# Patient Record
Sex: Female | Born: 1965 | Hispanic: No | Marital: Married | Smoking: Never smoker
Health system: Southern US, Community
[De-identification: ages and names within clinical notes are randomized; demographics above are authoritative.]

## PROBLEM LIST (undated history)

## (undated) DIAGNOSIS — K859 Acute pancreatitis without necrosis or infection, unspecified: Secondary | ICD-10-CM

## (undated) DIAGNOSIS — J189 Pneumonia, unspecified organism: Secondary | ICD-10-CM

## (undated) DIAGNOSIS — D649 Anemia, unspecified: Secondary | ICD-10-CM

## (undated) DIAGNOSIS — N200 Calculus of kidney: Secondary | ICD-10-CM

## (undated) DIAGNOSIS — E119 Type 2 diabetes mellitus without complications: Secondary | ICD-10-CM

## (undated) DIAGNOSIS — N390 Urinary tract infection, site not specified: Secondary | ICD-10-CM

## (undated) DIAGNOSIS — C50919 Malignant neoplasm of unspecified site of unspecified female breast: Secondary | ICD-10-CM

## (undated) HISTORY — DX: Calculus of kidney: N20.0

## (undated) HISTORY — DX: Pneumonia, unspecified organism: J18.9

## (undated) HISTORY — DX: Urinary tract infection, site not specified: N39.0

## (undated) HISTORY — DX: Acute pancreatitis without necrosis or infection, unspecified: K85.90

## (undated) HISTORY — DX: Anemia, unspecified: D64.9

## (undated) HISTORY — PX: PEG TUBE PLACEMENT: SUR1034

## (undated) HISTORY — PX: CHOLECYSTECTOMY: SHX55

## (undated) HISTORY — DX: Malignant neoplasm of unspecified site of unspecified female breast: C50.919

## (undated) HISTORY — PX: PANCREAS SURGERY: SHX731

## (undated) HISTORY — DX: Type 2 diabetes mellitus without complications: E11.9

---

## 1997-06-01 HISTORY — PX: OTHER SURGICAL HISTORY: SHX169

## 2010-08-31 ENCOUNTER — Ambulatory Visit: Payer: Self-pay | Admitting: Internal Medicine

## 2010-09-08 ENCOUNTER — Emergency Department: Payer: Self-pay | Admitting: Emergency Medicine

## 2010-09-09 ENCOUNTER — Inpatient Hospital Stay: Payer: Self-pay | Admitting: Internal Medicine

## 2010-09-09 DIAGNOSIS — R0789 Other chest pain: Secondary | ICD-10-CM

## 2010-09-23 ENCOUNTER — Inpatient Hospital Stay: Payer: Self-pay | Admitting: Internal Medicine

## 2010-09-25 ENCOUNTER — Inpatient Hospital Stay: Payer: Self-pay | Admitting: Psychiatry

## 2010-09-27 LAB — CANCER ANTIGEN 27.29: CA 27.29: 12.3 U/mL (ref 0.0–38.6)

## 2010-09-30 ENCOUNTER — Ambulatory Visit: Payer: Self-pay | Admitting: Gynecologic Oncology

## 2010-10-31 ENCOUNTER — Ambulatory Visit: Payer: Self-pay | Admitting: Gynecologic Oncology

## 2010-10-31 ENCOUNTER — Ambulatory Visit: Payer: Self-pay | Admitting: Internal Medicine

## 2012-11-01 IMAGING — US US RENAL KIDNEY
1 series · 14 of 25 positions shown · non-contrast
Comparison: none

REASON FOR EXAM: R/O Stones (urinary retention)
COMMENTS:

[Series 1: us renal kidney · 0.29mm/px · 14 of 26 slices shown]
[im 1/26]
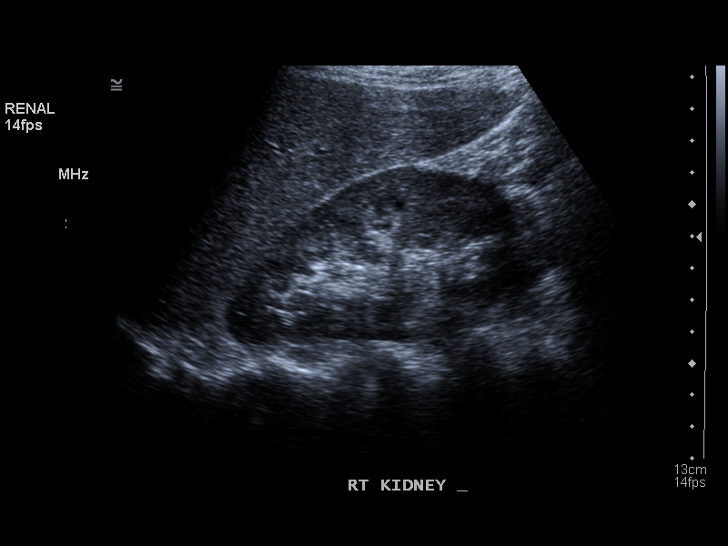
[im 3/26]
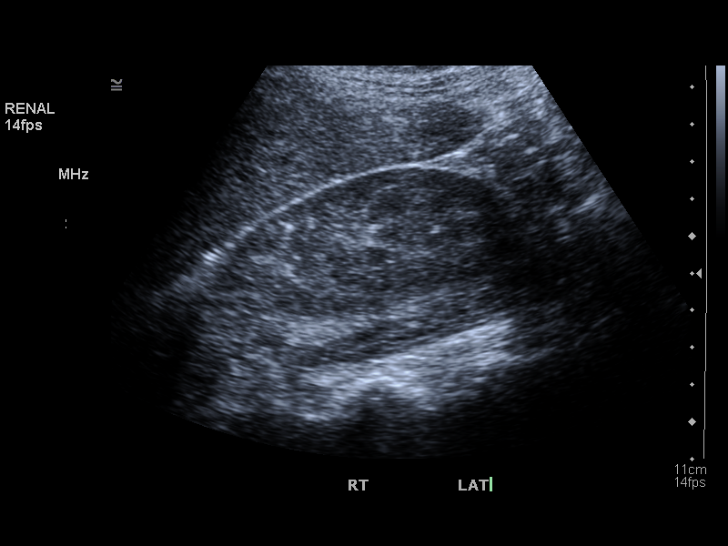
[im 5/26]
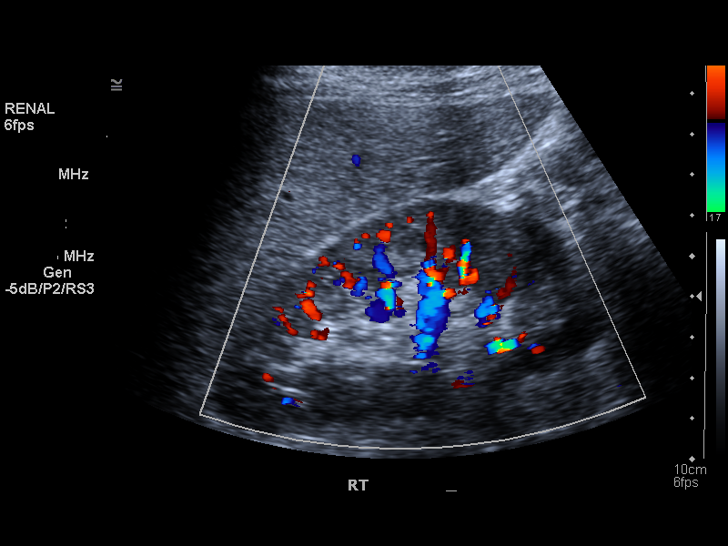
[im 7/26]
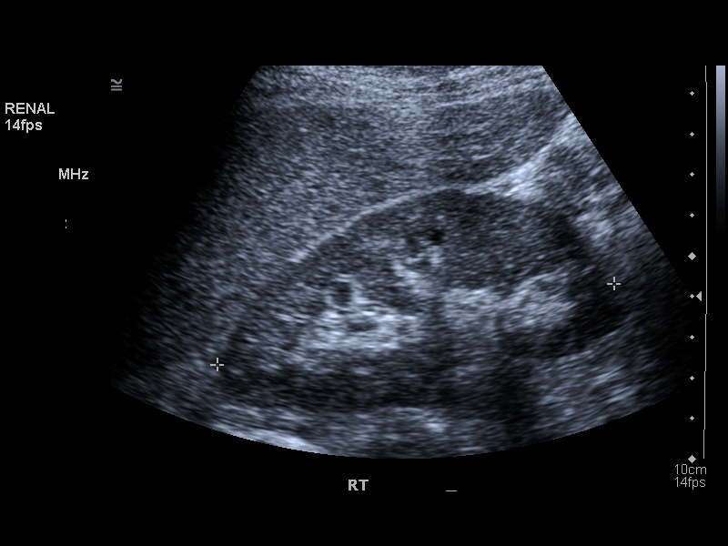
[im 9/26]
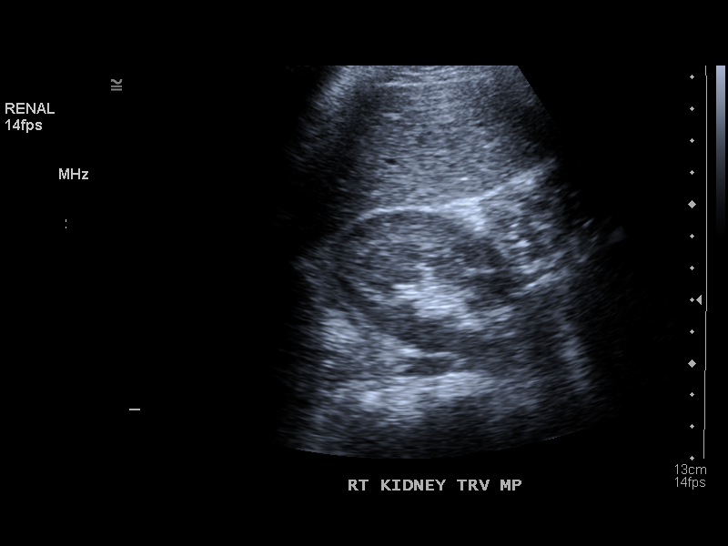
[im 10/26]
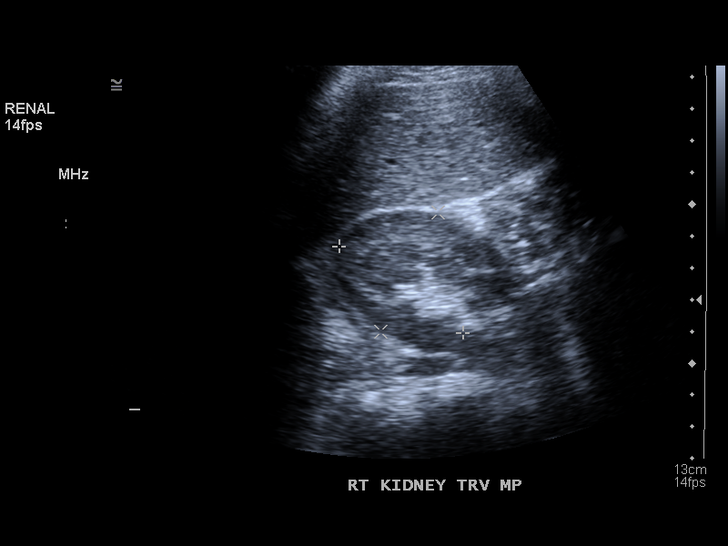
[im 12/26]
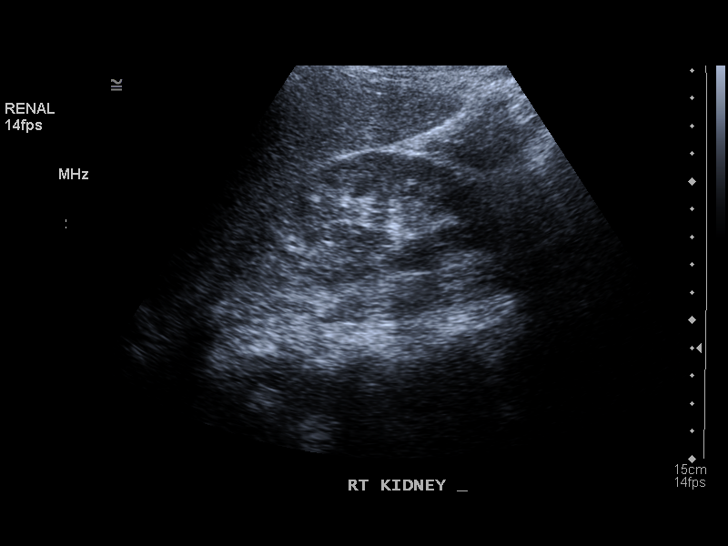
[im 14/26]
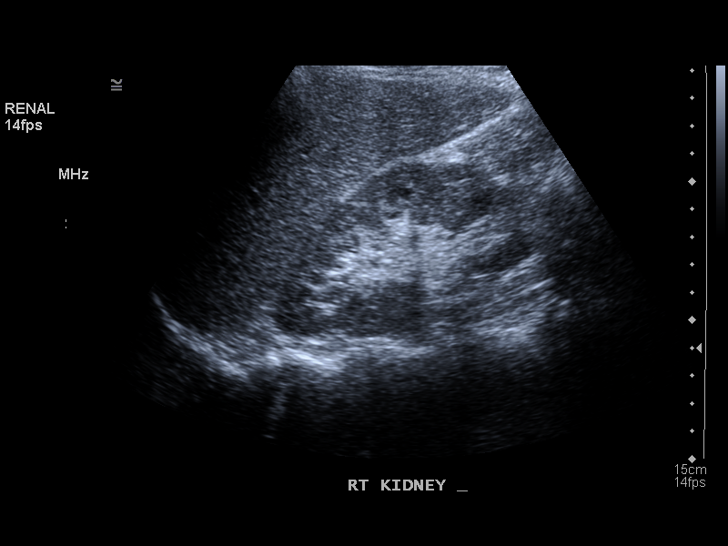
[im 16/26]
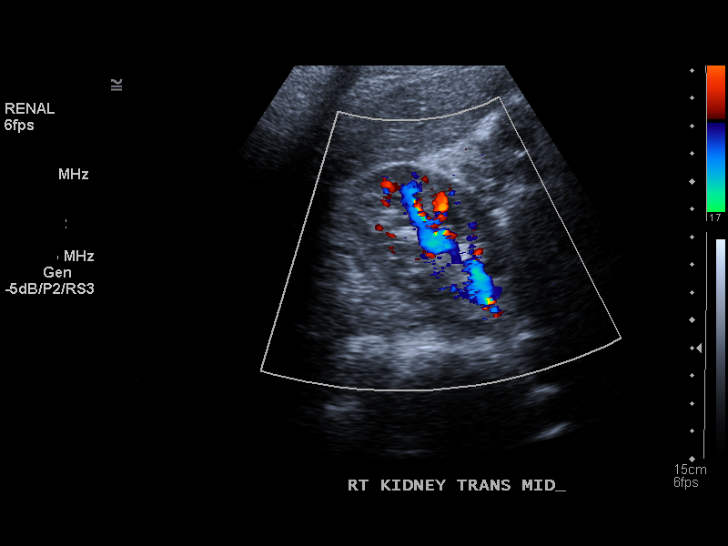
[im 17/26]
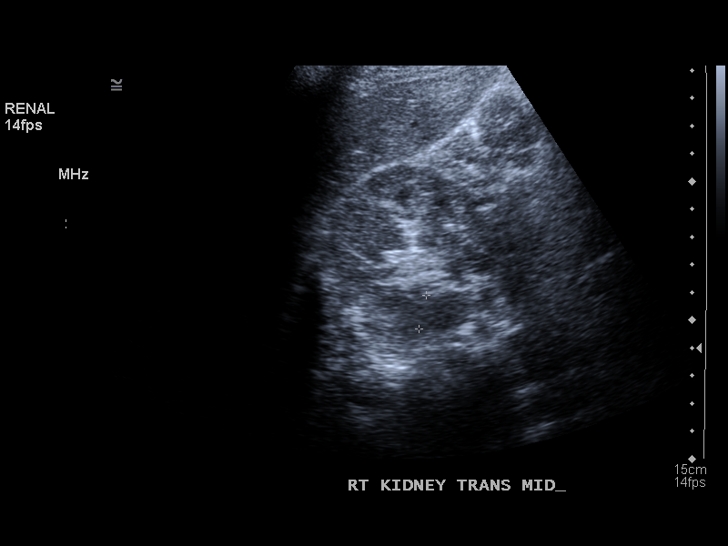
[im 19/26]
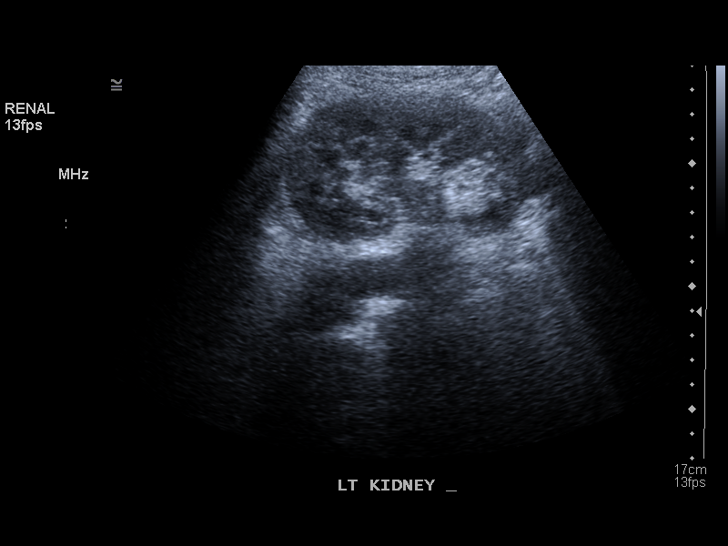
[im 21/26]
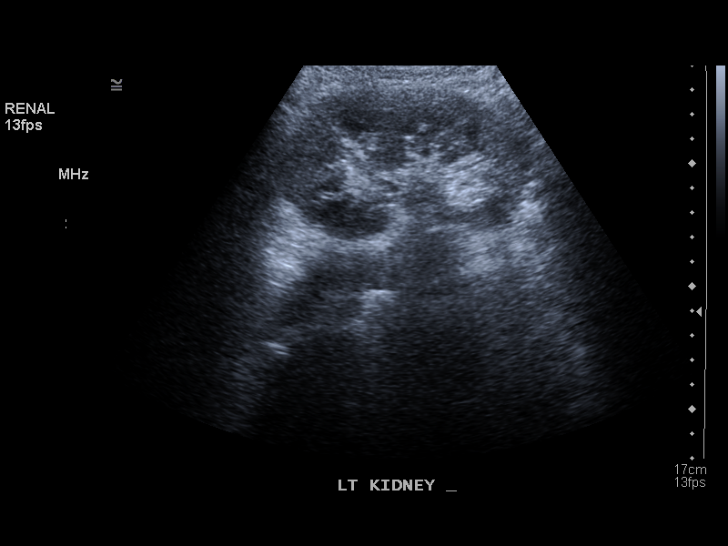
[im 23/26]
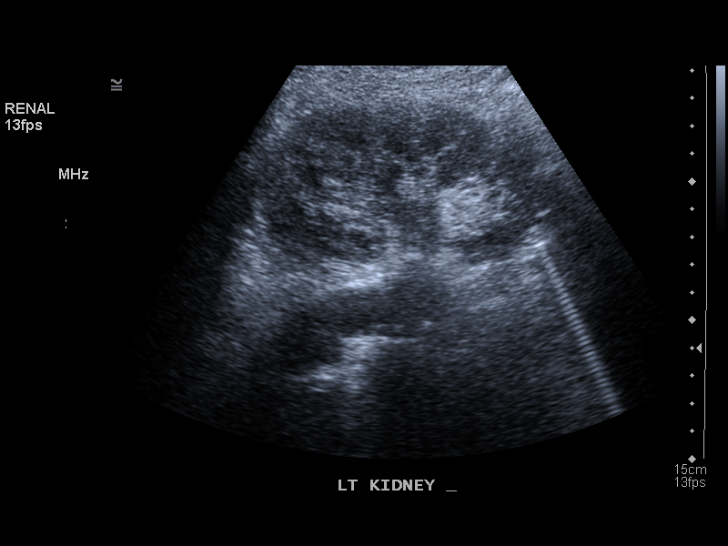
[im 26/26]
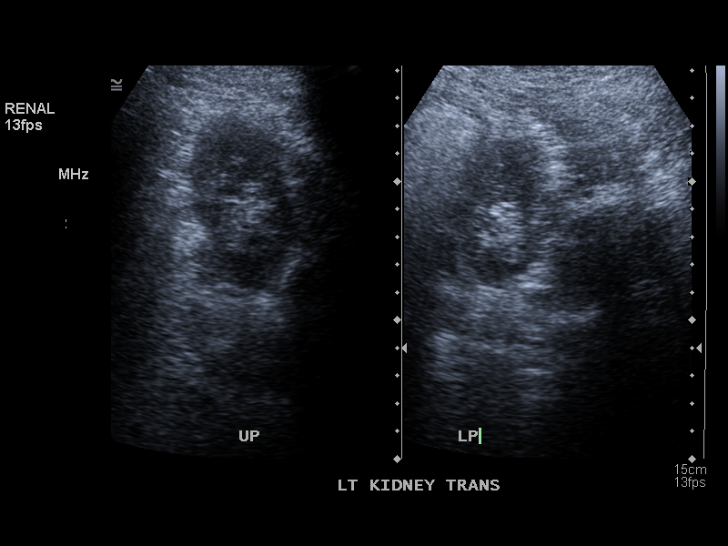

[14 of 25 positions shown; findings below may reference images not displayed]

PROCEDURE:     US  - US KIDNEY  - September 30, 2010 [DATE]

RESULT:     The right kidney measures 9.97 cm x 4.75 cm x 4.16 cm and the
left kidney measures 9.86 cm x 5.6 cm x 5.26 cm. The renal cortical margins
bilaterally are smooth. No solid or cystic renal mass lesions are seen. No
renal calcifications are noted. There is no hydronephrosis. The urinary
bladder is empty and is not visualized adequately for evaluation. A bladder
catheter is present.
IMPRESSION: 1. The kidneys show no significant abnormalities.
2. The urinary bladder is empty and is not visualized on this exam.

## 2014-11-27 ENCOUNTER — Encounter: Payer: Self-pay | Admitting: Internal Medicine

## 2015-01-31 ENCOUNTER — Ambulatory Visit (INDEPENDENT_AMBULATORY_CARE_PROVIDER_SITE_OTHER): Payer: BLUE CROSS/BLUE SHIELD | Admitting: Internal Medicine

## 2015-01-31 ENCOUNTER — Encounter: Payer: Self-pay | Admitting: Internal Medicine

## 2015-01-31 ENCOUNTER — Other Ambulatory Visit (INDEPENDENT_AMBULATORY_CARE_PROVIDER_SITE_OTHER): Payer: BLUE CROSS/BLUE SHIELD

## 2015-01-31 VITALS — BP 80/40 | HR 76 | Ht 67.25 in | Wt 125.0 lb

## 2015-01-31 DIAGNOSIS — Z8719 Personal history of other diseases of the digestive system: Secondary | ICD-10-CM

## 2015-01-31 DIAGNOSIS — Z853 Personal history of malignant neoplasm of breast: Secondary | ICD-10-CM | POA: Diagnosis not present

## 2015-01-31 DIAGNOSIS — R634 Abnormal weight loss: Secondary | ICD-10-CM

## 2015-01-31 DIAGNOSIS — T50905A Adverse effect of unspecified drugs, medicaments and biological substances, initial encounter: Secondary | ICD-10-CM

## 2015-01-31 DIAGNOSIS — K861 Other chronic pancreatitis: Secondary | ICD-10-CM

## 2015-01-31 LAB — COMPREHENSIVE METABOLIC PANEL
ALK PHOS: 52 U/L (ref 39–117)
ALT: 22 U/L (ref 0–35)
AST: 25 U/L (ref 0–37)
Albumin: 4.1 g/dL (ref 3.5–5.2)
BILIRUBIN TOTAL: 0.3 mg/dL (ref 0.2–1.2)
BUN: 7 mg/dL (ref 6–23)
CALCIUM: 9 mg/dL (ref 8.4–10.5)
CO2: 28 mEq/L (ref 19–32)
Chloride: 105 mEq/L (ref 96–112)
Creatinine, Ser: 0.59 mg/dL (ref 0.40–1.20)
GFR: 115.71 mL/min (ref >60.00)
GLUCOSE: 84 mg/dL (ref 70–99)
POTASSIUM: 4 meq/L (ref 3.5–5.1)
Sodium: 140 mEq/L (ref 135–145)
TOTAL PROTEIN: 6.8 g/dL (ref 6.0–8.3)

## 2015-01-31 LAB — CBC WITH DIFFERENTIAL/PLATELET
BASOS ABS: 0 10*3/uL (ref 0.0–0.1)
Basophils Relative: 1.3 % (ref 0.0–3.0)
Eosinophils Absolute: 0 10*3/uL (ref 0.0–0.7)
Eosinophils Relative: 0.1 % (ref 0.0–5.0)
Hemoglobin: 6.3 g/dL — CL (ref 12.0–15.0)
LYMPHS PCT: 26.3 % (ref 12.0–46.0)
Lymphs Abs: 0.8 10*3/uL (ref 0.7–4.0)
MCHC: 29.3 g/dL — ABNORMAL LOW (ref 30.0–36.0)
MCV: 64.7 fl — ABNORMAL LOW (ref 78.0–100.0)
MONOS PCT: 9.6 % (ref 3.0–12.0)
Monocytes Absolute: 0.3 10*3/uL (ref 0.1–1.0)
Neutro Abs: 1.9 10*3/uL (ref 1.4–7.7)
Neutrophils Relative %: 62.7 % (ref 43.0–77.0)
Platelets: 486 10*3/uL — ABNORMAL HIGH (ref 150.0–400.0)
RBC: 3.32 Mil/uL — AB (ref 3.87–5.11)
RDW: 19 % — ABNORMAL HIGH (ref 11.5–15.5)
WBC: 3.1 10*3/uL — ABNORMAL LOW (ref 4.0–10.5)

## 2015-01-31 LAB — IBC PANEL
Iron: 15 ug/dL — ABNORMAL LOW (ref 42–145)
SATURATION RATIOS: 2.9 % — AB (ref 20.0–50.0)
Transferrin: 371 mg/dL — ABNORMAL HIGH (ref 212.0–360.0)

## 2015-01-31 LAB — VITAMIN B12: VITAMIN B 12: 147 pg/mL — AB (ref 211–911)

## 2015-01-31 LAB — IGA: IGA: 152 mg/dL (ref 68–378)

## 2015-01-31 LAB — HEMOGLOBIN A1C: Hgb A1c MFr Bld: 5.3 % (ref 4.6–6.5)

## 2015-01-31 LAB — FOLATE: FOLATE: 7.3 ng/mL (ref >5.9)

## 2015-01-31 MED ORDER — PANCRELIPASE (LIP-PROT-AMYL) 25000 UNITS PO CPEP: 2.0000 | ORAL_CAPSULE | Freq: Three times a day (TID) | ORAL | Status: AC

## 2015-01-31 NOTE — Progress Notes (Signed)
Patient ID: Meredith Wallace, female   DOB: May 19, 1966, 49 y.o.   MRN: [id number] HPI: Meredith Wallace is a 49 year old female with a complex past medical history which includes but is not limited to chronic pancreatitis felt secondary to chemotherapy given for breast cancer who is seen to establish care. She is here alone today. She reports she was diagnosed with breast cancer in 2005. She had bilateral mastectomy as well as chemotherapy. No history of radiation. This was in [place] for she was living. In 2007 she developed pancreatitis though at that time when she had acute pancreatitis for the first time she was told she also had chronic pancreatitis though she was unaware of this. She spent she states the majority of a 5 year period in an out of the hospital. She reports multiple abdominal surgeries, history of PEG tube placement, for some time she was on parenteral nutrition. She reports being on multiple medications including oral and transdermal narcotics. She states that she wished for death during these years but for some reason they would not allow her to revoke her DO NOT RESUSCITATE. Eventually she started to improve and moved to [place] in 2012. She is now off all medications entirely. In May and June 2016 she had some mild upper abdominal pain reminiscent of pancreatitis but she is adamant that this was not a flare. The pain has since resolved. Today she has no abdominal pain. Reports appetite as good as she eats a very low-fat diet. She denies diarrhea. Reports bowel movements every 2-3 days. Denies blood in her stool or melena. She denies alcohol use. She is uncertain if her gallbladder has ever been removed. She's had one flare of acute pancreatitis and was transferred from [facility] to [facility] several years ago. No pancreatic care since this hospitalization. She was seen at [facility] in 2015 and oncology given her history of breast cancer. There is been no known recurrence of this disease. Family history  notable for breast cancer and her father had hepatitis C. 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 She has started traveling in the last 2 weeks playing poker again after having not done so since being ill with pancreatitis. She does report having not been able to gain weight over the last several months but she feels that this is secondary to restricting fat intake in her diet. She was previously taking Creon but found this not to be helpful.  no follow-up pancreatic care in at least 4 years since moving to [place]. She was seen at [facility]  Past Medical History  Diagnosis Date  . Anemia   . Breast cancer   . Diabetes 1998-2000  . Kidney stones 1997-present  . Pancreatitis   . Pneumonia 1972-2008  . Urinary tract infection     Past Surgical History  Procedure Laterality Date  . Cholecystectomy    . Pancreas surgery  2007-2011  . Stomach transection  1999    No outpatient prescriptions prior to visit.   No facility-administered medications prior to visit.    No Known Allergies  Family History  Problem Relation Age of Onset  . Heart disease Mother   . Diabetes Maternal Grandmother   . Hepatitis C Father     Social History  Substance Use Topics  . Smoking status: Never Smoker   . Smokeless tobacco: None  . Alcohol Use: 0.0 oz/week    0 Standard drinks or equivalent per week     Comment: occasionally    ROS:  As per history of present illness, otherwise negative  BP 80/40 mmHg  Pulse 76  Ht 5' 7.25" (1.708 m)  Wt 125 lb (56.7 kg)  BMI 19.44 kg/m2  LMP 06/02/1999 Constitutional: Well-developed, though thin pale female. No distress. HEENT: Normocephalic and atraumatic. Oropharynx is clear and moist. No oropharyngeal exudate. Conjunctivae are pale.  No scleral icterus. Neck: Neck supple. Trachea midline. Cardiovascular: Normal rate, regular rhythm and intact distal pulses. No M/R/G Pulmonary/chest: Effort normal and breath sounds normal. No  wheezing, rales or rhonchi. Abdominal: Soft, nontender, nondistended. Bowel sounds active throughout. Well-healed abdominal scars and former PEG site visible in the left upper quadrant  Extremities: no clubbing, cyanosis, trace pretibial edema Lymphadenopathy: No cervical adenopathy noted. Neurological: Alert and oriented to person place and time. Skin: Skin is warm and dry. No rashes noted. Psychiatric: Normal mood and affect. Behavior is normal.  RELEVANT LABS AND IMAGING: Labs ordered today We have tried to track down previous GI records from [place] but have yet been unsuccessful  ASSESSMENT/PLAN:  49 year old female with a complex past medical history which includes but is not limited to chronic pancreatitis felt secondary to chemotherapy given for breast cancer who is seen to establish care.   1. Chronic pancreatitis -- felt chemotherapy-induced. Some recent weight loss. Mild pain in May and June of this year though not needing to seek care for acute flare. I recommended repeat abdominal imaging given her history of chronic pancreatitis to evaluate pancreatic anatomy and also exclude malignancy. She is aware she is higher risk for pancreatic malignancy given history of chronic pancreatitis. MRI pancreatic protocol with contrast. CBC, CMP today. Given weight loss and going to start Zenpep 2 caps with meals.  2. History of breast cancer -- I feel appropriate that she establish with oncology locally. Oncology referral. She is also instructed in discussing external prostheses given her bilateral mastectomies.  Follow-up in 6 months unless labs or imaging deem earlier management    Cc:No referring provider defined for this encounter.

## 2015-01-31 NOTE — Patient Instructions (Addendum)
Your physician has requested that you go to the basement for the following lab work before leaving today: cbc, cmp.  You have been scheduled for an MRI at [facility] on 02/05/15. Your appointment time is 4 pm. Please arrive 15 minutes prior to your appointment time for registration purposes. Please make certain not to have anything to eat or drink 6 hours prior to your test. In addition, if you have any metal in your body, have a pacemaker or defibrillator, please be sure to let your ordering physician know. This test typically takes 45 minutes to 1 hour to complete.   We have sent the following medications to your pharmacy for you to pick up at your convenience: Zenpep 2 caps daily with meals.   We will call you with your referral to the [place].   Please call back to schedule a follow up in 6 months.

## 2015-02-01 ENCOUNTER — Other Ambulatory Visit: Payer: Self-pay | Admitting: Internal Medicine

## 2015-02-01 DIAGNOSIS — T50905A Adverse effect of unspecified drugs, medicaments and biological substances, initial encounter: Principal | ICD-10-CM

## 2015-02-01 DIAGNOSIS — K861 Other chronic pancreatitis: Secondary | ICD-10-CM

## 2015-02-05 ENCOUNTER — Telehealth: Payer: Self-pay | Admitting: *Deleted

## 2015-02-05 ENCOUNTER — Ambulatory Visit (HOSPITAL_COMMUNITY): Payer: BLUE CROSS/BLUE SHIELD

## 2015-02-05 ENCOUNTER — Ambulatory Visit (HOSPITAL_COMMUNITY): Admission: RE | Admit: 2015-02-05 | Payer: BLUE CROSS/BLUE SHIELD | Source: Ambulatory Visit

## 2015-02-05 DIAGNOSIS — D509 Iron deficiency anemia, unspecified: Secondary | ICD-10-CM

## 2015-02-05 MED ORDER — CYANOCOBALAMIN 1000 MCG/ML IJ SOLN: INTRAMUSCULAR | Status: AC

## 2015-02-05 MED ORDER — INTEGRA F 125-1 MG PO CAPS: 1.0000 | ORAL_CAPSULE | Freq: Every day | ORAL | Status: AC

## 2015-02-05 NOTE — Telephone Encounter (Signed)
I have spoken to patient to advise that her hemoglobin is very low at 6.3 and her iron and b12 are also low. I advised that per Dr Hilarie Fredrickson, he would like her to begin integra iron supplement, 1 capsule daily (rx sent to pharmacy) and that he would also like her to take b12 IM weekly x 1 month, then monthly thereafter (rx sent to pharmacy). Patient is scheduled for b12 injection teaching/1st injection on 02/11/15. Patient is in addition advised to begin an over the counter multivitamin rich in folate. She has been asked to let us know should she see any dark black stool or bright red blood with stool. Patient's MRI abdomen was rescheduled from today, 02/05/15 to 02/11/15. Insurance has not yet given approval for MRI and patient will be out of town from tomorrow until next Sunday evening. She will be back in town only on Monday of next week. Patient will come for MRI at [facility] radiology 02/11/15 @ 8 am. She will then come to our office for b12 injection training and will then go for labwork in our basement for CBC (per Dr Vena Rua request that CBC be rechecked in 1 week) and tissue transglutamase, folate (Dr Hilarie Fredrickson previously wanted these tests added to bloodwork drawn 02/01/15 but not enough blood was drawn to complete these tests). I advised patient that she may need endoscopy/colonoscopy but that we would make a more definative plan after we receive MRI results. She verbalizes understanding of all the above. Of note, patient states that Dr Candis Schatz in [place], [place] was the surgeon that did at least 2 of her pancreatic surgeries.

## 2015-02-05 NOTE — Telephone Encounter (Signed)
-----   Message from Algernon Huxley, RN sent at 02/05/2015 10:10 AM EDT ----- .

## 2015-02-11 ENCOUNTER — Ambulatory Visit (HOSPITAL_COMMUNITY): Admission: RE | Admit: 2015-02-11 | Payer: BLUE CROSS/BLUE SHIELD | Source: Ambulatory Visit

## 2015-02-11 ENCOUNTER — Telehealth: Payer: Self-pay | Admitting: *Deleted

## 2015-02-11 ENCOUNTER — Other Ambulatory Visit: Payer: Self-pay | Admitting: Internal Medicine

## 2015-02-11 NOTE — Telephone Encounter (Signed)
Received referral from [facility] requesting pt to be seen since she has moved to [facility].  Called pt and she lives in [place] and would like to be seen at [place].  Emailed Tinnie Gens at referring to make her aware and request for her to f/u with them on scheduling.  Changed location in referral to [place].

## 2015-02-13 ENCOUNTER — Telehealth: Payer: Self-pay | Admitting: Internal Medicine

## 2015-02-13 NOTE — Telephone Encounter (Signed)
Patient states that actually, pharmacy gave her the needle.

## 2015-02-18 ENCOUNTER — Ambulatory Visit (HOSPITAL_COMMUNITY): Admission: RE | Admit: 2015-02-18 | Payer: BLUE CROSS/BLUE SHIELD | Source: Ambulatory Visit

## 2015-02-25 ENCOUNTER — Telehealth: Payer: Self-pay | Admitting: Internal Medicine

## 2015-02-25 DIAGNOSIS — R634 Abnormal weight loss: Secondary | ICD-10-CM

## 2015-02-25 DIAGNOSIS — K861 Other chronic pancreatitis: Secondary | ICD-10-CM

## 2015-02-25 DIAGNOSIS — D649 Anemia, unspecified: Secondary | ICD-10-CM

## 2015-02-25 DIAGNOSIS — T50905A Adverse effect of unspecified drugs, medicaments and biological substances, initial encounter: Secondary | ICD-10-CM

## 2015-02-25 NOTE — Telephone Encounter (Signed)
Pt states she cannot afford the MRI, she would have to pay the deductible out of pocket and she states she cannot afford it. MRI cancelled and Dr. Hilarie Fredrickson aware.

## 2015-02-26 ENCOUNTER — Ambulatory Visit (HOSPITAL_COMMUNITY): Admission: RE | Admit: 2015-02-26 | Payer: BLUE CROSS/BLUE SHIELD | Source: Ambulatory Visit

## 2015-02-26 NOTE — Telephone Encounter (Signed)
Left message for pt to call back  °

## 2015-02-26 NOTE — Telephone Encounter (Signed)
I understand financial constraint, however given her history, In terms and profound anemia I would like her to have cross-sectional imaging. Would CT scan be more affordable for her? If so please order CT scan abdomen and pelvis with IV contrast, attention to pancreas She also needs to return for repeat CBC and iron studies She also will likely need endoscopic procedures given iron deficiency but would like to CT scan and repeat labs first

## 2015-02-28 NOTE — Telephone Encounter (Signed)
Left message for pt to call back  °

## 2015-03-04 NOTE — Telephone Encounter (Signed)
Unable to reach pt after multiple attempts. Pt mailed a letter.

## 2015-03-05 ENCOUNTER — Other Ambulatory Visit: Payer: Self-pay

## 2015-03-05 DIAGNOSIS — D509 Iron deficiency anemia, unspecified: Secondary | ICD-10-CM

## 2015-03-14 ENCOUNTER — Telehealth: Payer: Self-pay | Admitting: Internal Medicine

## 2015-03-14 NOTE — Telephone Encounter (Signed)
I have contacted [facility] who will change patient's script to 90 day supply.

## 2015-03-14 NOTE — Telephone Encounter (Signed)
Left message for patient to call back  

## 2015-03-14 NOTE — Addendum Note (Signed)
Addended by: Larina Bras on: 03/14/2015 03:48 PM   Modules accepted: Orders

## 2015-03-14 NOTE — Telephone Encounter (Signed)
Patient called back to schedule CT. She is scheduled for CT abd/pelvis W/contrast attn pancreas @4 :[address] on 03/20/15 @ 4:00 pm . She will also come for labs tomorrow (CBC, IBC). Patient verbalizes understanding.

## 2015-03-20 ENCOUNTER — Inpatient Hospital Stay: Admission: RE | Admit: 2015-03-20 | Payer: BLUE CROSS/BLUE SHIELD | Source: Ambulatory Visit

## 2015-03-20 ENCOUNTER — Telehealth: Payer: Self-pay | Admitting: Internal Medicine

## 2015-03-20 NOTE — Telephone Encounter (Signed)
Pt was scheduled to have CT scan yesterday and claimed she knew nothing about appt. Pt was given appt over the phone 03/14/15. Pt rescheduled to tomorrow and then called back and cancelled the scan because she states she is not going to pay 18,000.

## 2015-03-20 NOTE — Telephone Encounter (Signed)
Pt needs ROV with me

## 2015-03-21 ENCOUNTER — Other Ambulatory Visit: Payer: BLUE CROSS/BLUE SHIELD

## 2015-03-21 NOTE — Telephone Encounter (Signed)
Pt already has OV scheduled 05/15/15@11am .

## 2015-05-02 ENCOUNTER — Encounter: Payer: Self-pay | Admitting: *Deleted

## 2015-05-15 ENCOUNTER — Ambulatory Visit: Payer: BLUE CROSS/BLUE SHIELD | Admitting: Internal Medicine

## 2016-04-09 ENCOUNTER — Encounter: Payer: Self-pay | Admitting: Emergency Medicine

## 2016-04-09 ENCOUNTER — Emergency Department: Payer: BLUE CROSS/BLUE SHIELD

## 2016-04-09 ENCOUNTER — Emergency Department
Admission: EM | Admit: 2016-04-09 | Discharge: 2016-04-09 | Disposition: A | Discharge status: Home / Self Care | Payer: BLUE CROSS/BLUE SHIELD | Attending: Emergency Medicine | Admitting: Emergency Medicine

## 2016-04-09 DIAGNOSIS — Z79899 Other long term (current) drug therapy: Secondary | ICD-10-CM | POA: Insufficient documentation

## 2016-04-09 DIAGNOSIS — E119 Type 2 diabetes mellitus without complications: Secondary | ICD-10-CM | POA: Insufficient documentation

## 2016-04-09 DIAGNOSIS — Z853 Personal history of malignant neoplasm of breast: Secondary | ICD-10-CM | POA: Insufficient documentation

## 2016-04-09 DIAGNOSIS — Y939 Activity, unspecified: Secondary | ICD-10-CM | POA: Insufficient documentation

## 2016-04-09 DIAGNOSIS — Y929 Unspecified place or not applicable: Secondary | ICD-10-CM | POA: Diagnosis not present

## 2016-04-09 DIAGNOSIS — S99912A Unspecified injury of left ankle, initial encounter: Secondary | ICD-10-CM | POA: Diagnosis present

## 2016-04-09 DIAGNOSIS — W19XXXA Unspecified fall, initial encounter: Secondary | ICD-10-CM | POA: Diagnosis not present

## 2016-04-09 DIAGNOSIS — S93402A Sprain of unspecified ligament of left ankle, initial encounter: Secondary | ICD-10-CM | POA: Diagnosis not present

## 2016-04-09 DIAGNOSIS — Y999 Unspecified external cause status: Secondary | ICD-10-CM | POA: Diagnosis not present

## 2016-04-09 NOTE — ED Provider Notes (Signed)
[facility] Emergency Department Provider Note   ____________________________________________   First MD Initiated Contact with Patient 04/09/16 0813     (approximate)  I have reviewed the triage vital signs and the nursing notes.   HISTORY  Chief Complaint Ankle Pain    HPI Meredith Wallace is a 50 y.o. female patient complaining of left ankle pain for 1 day. Patient states this incident yesterday. Patient stated initially the pain and edema was minimal but overnight this worsens. Patient states she's been wearing an elastic ankle support which provided moderate relief. Patient rates the pain as a 4/10. Patient described a pain as "achy". No other palliative measures for his complaint.   Past Medical History:  Diagnosis Date  . Anemia   . Breast cancer ([place])   . Diabetes ([place]) 1998-2000  . Kidney stones 1997-present  . Pancreatitis   . Pneumonia 1972-2008  . Urinary tract infection     Patient Active Problem List   Diagnosis Date Noted  . Drug-induced chronic pancreatitis ([place]) 01/31/2015  . History of breast cancer in female 01/31/2015    Past Surgical History:  Procedure Laterality Date  . CHOLECYSTECTOMY    . PANCREAS SURGERY  2007-2011  . PEG TUBE PLACEMENT    . stomach transection  1999    Prior to Admission medications   Medication Sig Start Date End Date Taking? Authorizing Provider  cyanocobalamin (,VITAMIN B-12,) 1000 MCG/ML injection Inject 64ml (1,000 mcg) intramuscularly once weekly x 4 weeks, then once monthly thereafter. 02/05/15   Jerene Bears, MD  Fe Fum-FePoly-FA-Vit C-Vit B3 (INTEGRA F) 125-1 MG CAPS Take 1 capsule by mouth daily. 02/05/15   Jerene Bears, MD  Pancrelipase, Lip-Prot-Amyl, (ZENPEP) 25000 UNITS CPEP Take 2 capsules by mouth 3 (three) times daily with meals. 01/31/15   Jerene Bears, MD    Allergies Nsaids  Family History  Problem Relation Age of Onset  . Heart disease Mother   . Diabetes Maternal Grandmother     . Hepatitis C Father     Social History Social History  Substance Use Topics  . Smoking status: Never Smoker  . Smokeless tobacco: Never Used  . Alcohol use 0.0 oz/week     Comment: occasionally    Review of Systems Constitutional: No fever/chills Eyes: No visual changes. ENT: No sore throat. Cardiovascular: Denies chest pain. Respiratory: Denies shortness of breath. Gastrointestinal: No abdominal pain.  No nausea, no vomiting.  No diarrhea.  No constipation. Genitourinary: Negative for dysuria. Musculoskeletal: Negative for back pain. Skin: Negative for rash. Neurological: Negative for headaches, focal weakness or numbness. Endocrine:Diabetes Hematological/Lymphatic: Allergic/Immunilogical: NSAIDs. ___________________________________________   PHYSICAL EXAM:  VITAL SIGNS: ED Triage Vitals  Enc Vitals Group     BP 04/09/16 0748 117/85     Pulse Rate 04/09/16 0748 95     Resp 04/09/16 0748 18     Temp 04/09/16 0748 98.7 F (37.1 C)     Temp Source 04/09/16 0748 Oral     SpO2 04/09/16 0748 98 %     Weight 04/09/16 0749 145 lb (65.8 kg)     Height 04/09/16 0749 5\' 8"  (1.727 m)     Head Circumference --      Peak Flow --      Pain Score 04/09/16 0749 4     Pain Loc --      Pain Edu? --      Excl. in [place]? --     Constitutional: Alert and oriented. Well  appearing and in no acute distress. Eyes: Conjunctivae are normal. PERRL. EOMI. Head: Atraumatic. Nose: No congestion/rhinnorhea. Mouth/Throat: Mucous membranes are moist.  Oropharynx non-erythematous. Neck: No stridor.  No cervical spine tenderness to palpation. Hematological/Lymphatic/Immunilogical: No cervical lymphadenopathy. Cardiovascular: Normal rate, regular rhythm. Grossly normal heart sounds.  Good peripheral circulation. Respiratory: Normal respiratory effort.  No retractions. Lungs CTAB. Gastrointestinal: Soft and nontender. No distention. No abdominal bruits. No CVA tenderness. Musculoskeletal: No  obvious deformity to the left ankle. Moderate edema to the lateral malleolus. Patient has full nuchal range of motion.  Neurologic:  Normal speech and language. No gross focal neurologic deficits are appreciated. No gait instability. Skin:  Skin is warm, dry and intact. No rash noted. Psychiatric: Mood and affect are normal. Speech and behavior are normal.  ____________________________________________   LABS (all labs ordered are listed, but only abnormal results are displayed)  Labs Reviewed - No data to display ____________________________________________  EKG   ____________________________________________  RADIOLOGY  No acute findings on x-ray. ____________________________________________   PROCEDURES  Procedure(s) performed: None  Procedures  Critical Care performed: No  ____________________________________________   INITIAL IMPRESSION / ASSESSMENT AND PLAN / ED COURSE  Pertinent labs & imaging results that were available during my care of the patient were reviewed by me and considered in my medical decision making (see chart for details).  Left ankle sprain. Discussed x-ray findings with patient. Patient placed in the ankle stirrup splint and given discharge care instructions. Patient advised follow-up PCP for continual care.  Clinical Course      ____________________________________________   FINAL CLINICAL IMPRESSION(S) / ED DIAGNOSES  Final diagnoses:  Sprain of left ankle, unspecified ligament, initial encounter      NEW MEDICATIONS STARTED DURING THIS VISIT:  New Prescriptions   No medications on file     Note:  This document was prepared using Dragon voice recognition software and may include unintentional dictation errors.    Sable Feil, PA-C 04/09/16 [id number]    Orbie Pyo, MD 04/09/16 [id number]

## 2016-04-09 NOTE — ED Triage Notes (Signed)
Left ankle pain , x1 day, pt ambulating with a limp

## 2016-04-09 NOTE — Discharge Instructions (Signed)
Wear splint for 3-5 days as needed. °

## 2016-05-28 ENCOUNTER — Emergency Department
Admission: EM | Admit: 2016-05-28 | Discharge: 2016-05-28 | Disposition: A | Discharge status: Home / Self Care | Payer: BLUE CROSS/BLUE SHIELD | Attending: Emergency Medicine | Admitting: Emergency Medicine

## 2016-05-28 ENCOUNTER — Encounter: Payer: Self-pay | Admitting: Emergency Medicine

## 2016-05-28 DIAGNOSIS — E119 Type 2 diabetes mellitus without complications: Secondary | ICD-10-CM | POA: Diagnosis not present

## 2016-05-28 DIAGNOSIS — M6281 Muscle weakness (generalized): Secondary | ICD-10-CM | POA: Diagnosis not present

## 2016-05-28 DIAGNOSIS — N390 Urinary tract infection, site not specified: Secondary | ICD-10-CM | POA: Insufficient documentation

## 2016-05-28 DIAGNOSIS — Z853 Personal history of malignant neoplasm of breast: Secondary | ICD-10-CM | POA: Insufficient documentation

## 2016-05-28 DIAGNOSIS — R531 Weakness: Secondary | ICD-10-CM

## 2016-05-28 DIAGNOSIS — R42 Dizziness and giddiness: Secondary | ICD-10-CM | POA: Diagnosis present

## 2016-05-28 LAB — BASIC METABOLIC PANEL
Anion gap: 9 (ref 5–15)
BUN: 14 mg/dL (ref 6–20)
CHLORIDE: 102 mmol/L (ref 101–111)
CO2: 27 mmol/L (ref 22–32)
CREATININE: 0.66 mg/dL (ref 0.44–1.00)
Calcium: 9.7 mg/dL (ref 8.9–10.3)
GFR calc Af Amer: >60 mL/min (ref >60)
GFR calc non Af Amer: >60 mL/min (ref >60)
GLUCOSE: 102 mg/dL — AB (ref 65–99)
POTASSIUM: 4 mmol/L (ref 3.5–5.1)
SODIUM: 138 mmol/L (ref 135–145)

## 2016-05-28 LAB — CBC
HEMATOCRIT: 31 % — AB (ref 35.0–47.0)
Hemoglobin: 9.5 g/dL — ABNORMAL LOW (ref 12.0–16.0)
MCH: 22.1 pg — AB (ref 26.0–34.0)
MCHC: 30.8 g/dL — AB (ref 32.0–36.0)
MCV: 71.8 fL — AB (ref 80.0–100.0)
PLATELETS: 486 10*3/uL — AB (ref 150–440)
RBC: 4.32 MIL/uL (ref 3.80–5.20)
RDW: 17.7 % — AB (ref 11.5–14.5)
WBC: 5.8 10*3/uL (ref 3.6–11.0)

## 2016-05-28 LAB — URINALYSIS, COMPLETE (UACMP) WITH MICROSCOPIC
Bilirubin Urine: NEGATIVE
GLUCOSE, UA: NEGATIVE mg/dL
Hgb urine dipstick: NEGATIVE
KETONES UR: NEGATIVE mg/dL
LEUKOCYTES UA: NEGATIVE
Nitrite: NEGATIVE
PH: 6 (ref 5.0–8.0)
Protein, ur: NEGATIVE mg/dL
RBC / HPF: NONE SEEN RBC/hpf (ref 0–5)
SPECIFIC GRAVITY, URINE: 1.004 — AB (ref 1.005–1.030)

## 2016-05-28 LAB — TROPONIN I: Troponin I: <0.03 ng/mL (ref <0.03)

## 2016-05-28 MED ORDER — FLUCONAZOLE 100 MG PO TABS
150.0000 mg | ORAL_TABLET | Freq: Once | ORAL | Status: AC
  Administered 2016-05-28: 150 mg via ORAL
  Filled 2016-05-28: qty 1

## 2016-05-28 MED ORDER — FERROUS SULFATE 325 (65 FE) MG PO TABS
325.0000 mg | ORAL_TABLET | Freq: Two times a day (BID) | ORAL | 1 refills | Status: AC
Start: 2016-05-28 — End: 2017-05-28

## 2016-05-28 MED ORDER — FLUCONAZOLE 150 MG PO TABS: 150.0000 mg | ORAL_TABLET | Freq: Every day | ORAL | 0 refills | Status: AC

## 2016-05-28 MED ORDER — CEPHALEXIN 500 MG PO CAPS
500.0000 mg | ORAL_CAPSULE | Freq: Once | ORAL | Status: AC
  Administered 2016-05-28: 500 mg via ORAL
  Filled 2016-05-28: qty 1

## 2016-05-28 MED ORDER — SODIUM CHLORIDE 0.9 % IV BOLUS (SEPSIS)
1000.0000 mL | Freq: Once | INTRAVENOUS | Status: AC
  Administered 2016-05-28: 1000 mL via INTRAVENOUS

## 2016-05-28 MED ORDER — CEPHALEXIN 500 MG PO CAPS: 500.0000 mg | ORAL_CAPSULE | Freq: Three times a day (TID) | ORAL | 0 refills | Status: AC

## 2016-05-28 NOTE — ED Provider Notes (Signed)
[facility] Emergency Department Provider Note  Time seen: 7:30 PM  I have reviewed the triage vital signs and the nursing notes.   HISTORY  Chief Complaint Weakness and Dizziness    HPI Meredith Wallace is a 50 y.o. female with a past medical history of anemia, diabetes, who presents to the emergency department generalized weakness. According to the patient approximately 6 months ago she stopped taking her iron supplements. She states over the past 2-3 weeks she has began feeling extremely weak so she restarted taking her iron supplements. She thought that her hemoglobin could be down so she came to the emergency department for evaluation. Patient states her normal hemoglobin runs between 6-7. Denies any recent nausea, vomiting, diarrhea, abdominal pain, chest pain, dysuria or black or bloody stool. Denies any cough or congestion.  Past Medical History:  Diagnosis Date  . Anemia   . Breast cancer ([place])   . Diabetes ([place]) 1998-2000  . Kidney stones 1997-present  . Pancreatitis   . Pneumonia 1972-2008  . Urinary tract infection     Patient Active Problem List   Diagnosis Date Noted  . 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 History of breast cancer in female 01/31/2015    Past Surgical History:  Procedure Laterality Date  . CHOLECYSTECTOMY    . PANCREAS SURGERY  2007-2011  . PEG TUBE PLACEMENT    . stomach transection  1999    Prior to Admission medications   Medication Sig Start Date End Date Taking? Authorizing Provider  cyanocobalamin (,VITAMIN B-12,) 1000 MCG/ML injection Inject 66ml (1,000 mcg) intramuscularly once weekly x 4 weeks, then once monthly thereafter. 02/05/15   Jerene Bears, MD  Fe Fum-FePoly-FA-Vit C-Vit B3 (INTEGRA F) 125-1 MG CAPS Take 1 capsule by mouth daily. 02/05/15   Jerene Bears, MD  Pancrelipase, Lip-Prot-Amyl, (ZENPEP) 25000 UNITS CPEP Take 2 capsules by mouth 3 (three) times daily with meals. 01/31/15   Jerene Bears,  MD    Allergies  Allergen Reactions  . Nsaids Other (See Comments)    Due to weight loss surgery    Family History  Problem Relation Age of Onset  . Heart disease Mother   . Diabetes Maternal Grandmother   . Hepatitis C Father     Social History Social History  Substance Use Topics  . Smoking status: Never Smoker  . Smokeless tobacco: Never Used  . Alcohol use 0.0 oz/week     Comment: occasionally    Review of Systems Constitutional: Negative for fever.Positive for generalized weakness. Cardiovascular: Negative for chest pain. Respiratory: Negative for shortness of breath. Gastrointestinal: Negative for abdominal pain, vomiting and diarrhea. Genitourinary: Negative for dysuria. Neurological: Negative for headache 10-point ROS otherwise negative.  ____________________________________________   PHYSICAL EXAM:  VITAL SIGNS: ED Triage Vitals  Enc Vitals Group     BP 05/28/16 1730 (!) 142/61     Pulse Rate 05/28/16 1730 79     Resp 05/28/16 1730 18     Temp 05/28/16 1730 98.2 F (36.8 C)     Temp Source 05/28/16 1730 Oral     SpO2 05/28/16 1730 99 %     Weight 05/28/16 1731 145 lb (65.8 kg)     Height 05/28/16 1731 5\' 8"  (1.727 m)     Head Circumference --      Peak Flow --      Pain Score 05/28/16 1731 0     Pain Loc --      Pain Edu? --  Excl. in [place]? --    Constitutional: Alert and oriented. Well appearing and in no distress. Eyes: Normal exam ENT   Head: Normocephalic and atraumatic   Mouth/Throat: Mucous membranes are moist. Cardiovascular: Normal rate, regular rhythm. No murmur Respiratory: Normal respiratory effort without tachypnea nor retractions. Breath sounds are clear  Gastrointestinal: Soft and nontender. No distention. Musculoskeletal: Nontender with normal range of motion in all extremities. Neurologic:  Normal speech and language. No gross focal neurologic deficits Skin:  Skin is warm, dry and intact.  Psychiatric: Mood and  affect are normal.   ____________________________________________    EKG  EKG reviewed and interpreted, so shows normal sinus rhythm at 73 bpm, normal axis, normal intervals, no concerning ST changes. Overall normal EKG.  ____________________________________________    INITIAL IMPRESSION / ASSESSMENT AND PLAN / ED COURSE  Pertinent labs & imaging results that were available during my care of the patient were reviewed by me and considered in my medical decision making (see chart for details).  Patient presents the emergency department generalized weakness for the past several weeks. Overall the patient appears well, suspected that her hemoglobin could be substantially decreased from her baseline however her hemoglobin today is 9.5. Currently pending troponin and urinalysis. We will IV hydrate while awaiting the rest of the results. Overall the patient appears well, no acute distress.  Labs are largely within normal limits besides many bacteria on urinalysis. We will send the urine for a culture. Discharge on Keflex. ____________________________________________   FINAL CLINICAL IMPRESSION(S) / ED DIAGNOSES  Generalized weakness Urinary tract infection   Harvest Dark, MD 05/28/16 2022

## 2016-05-28 NOTE — ED Triage Notes (Signed)
Pt to ed with c/o weakness and dizziness x 2 weeks. Pt states hx of anemia, reports usually hemoglobin is around 6.  Pt reports blurred vision associated with weakness and dizziness.  Skin warm and dry,  Skin pale.

## 2016-05-31 LAB — URINE CULTURE

## 2018-05-12 IMAGING — CR DG ANKLE COMPLETE 3+V*L*
3 series · 3 of 3 positions shown · non-contrast
Comparison: None.

CLINICAL DATA: Pain following fall.  History of breast carcinoma.

EXAM:
LEFT ANKLE COMPLETE - 3+ VIEW

[ankle ap]
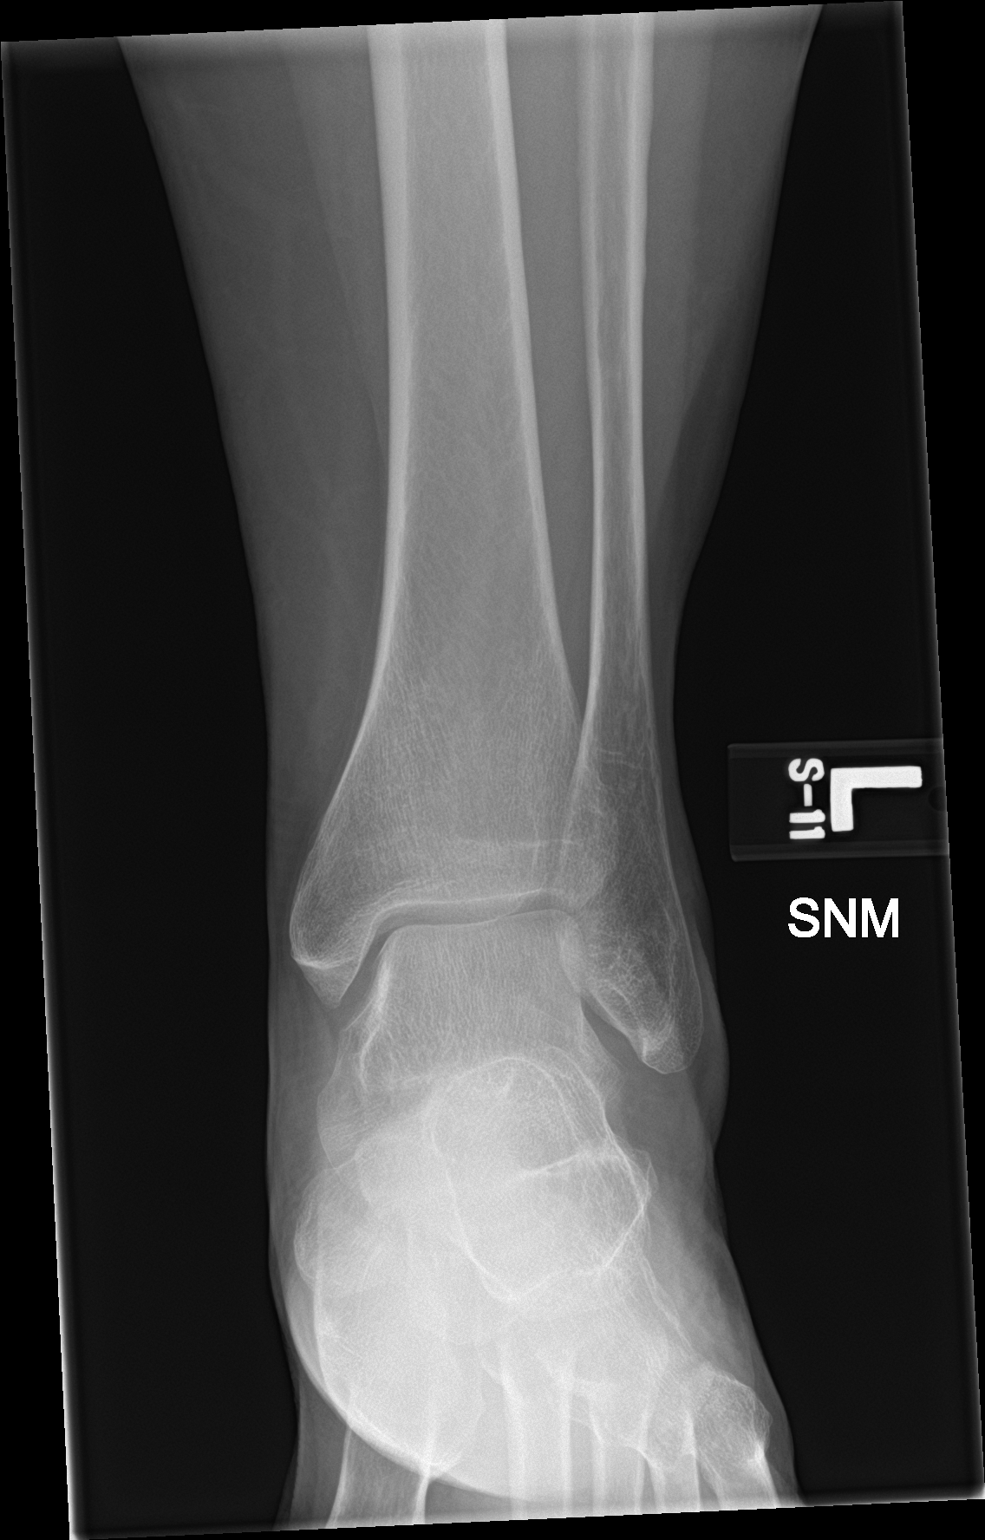

[ankle obl]
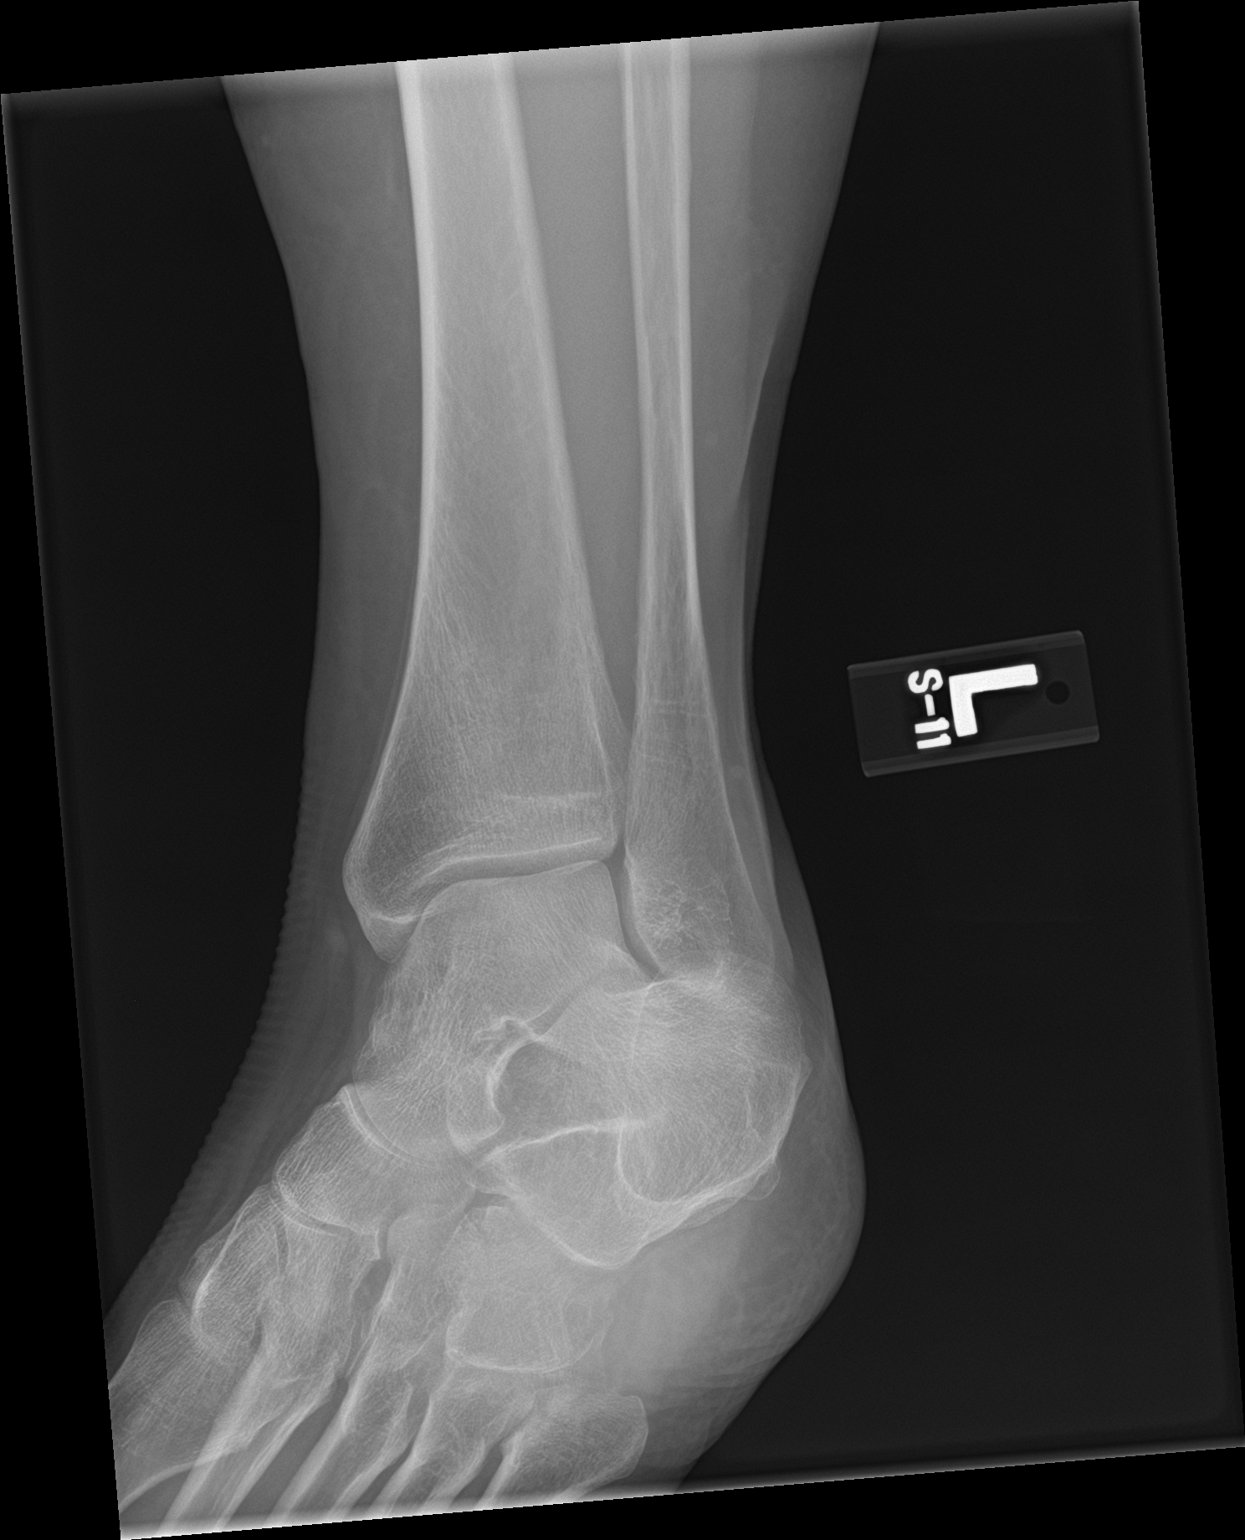

[ankle lat]
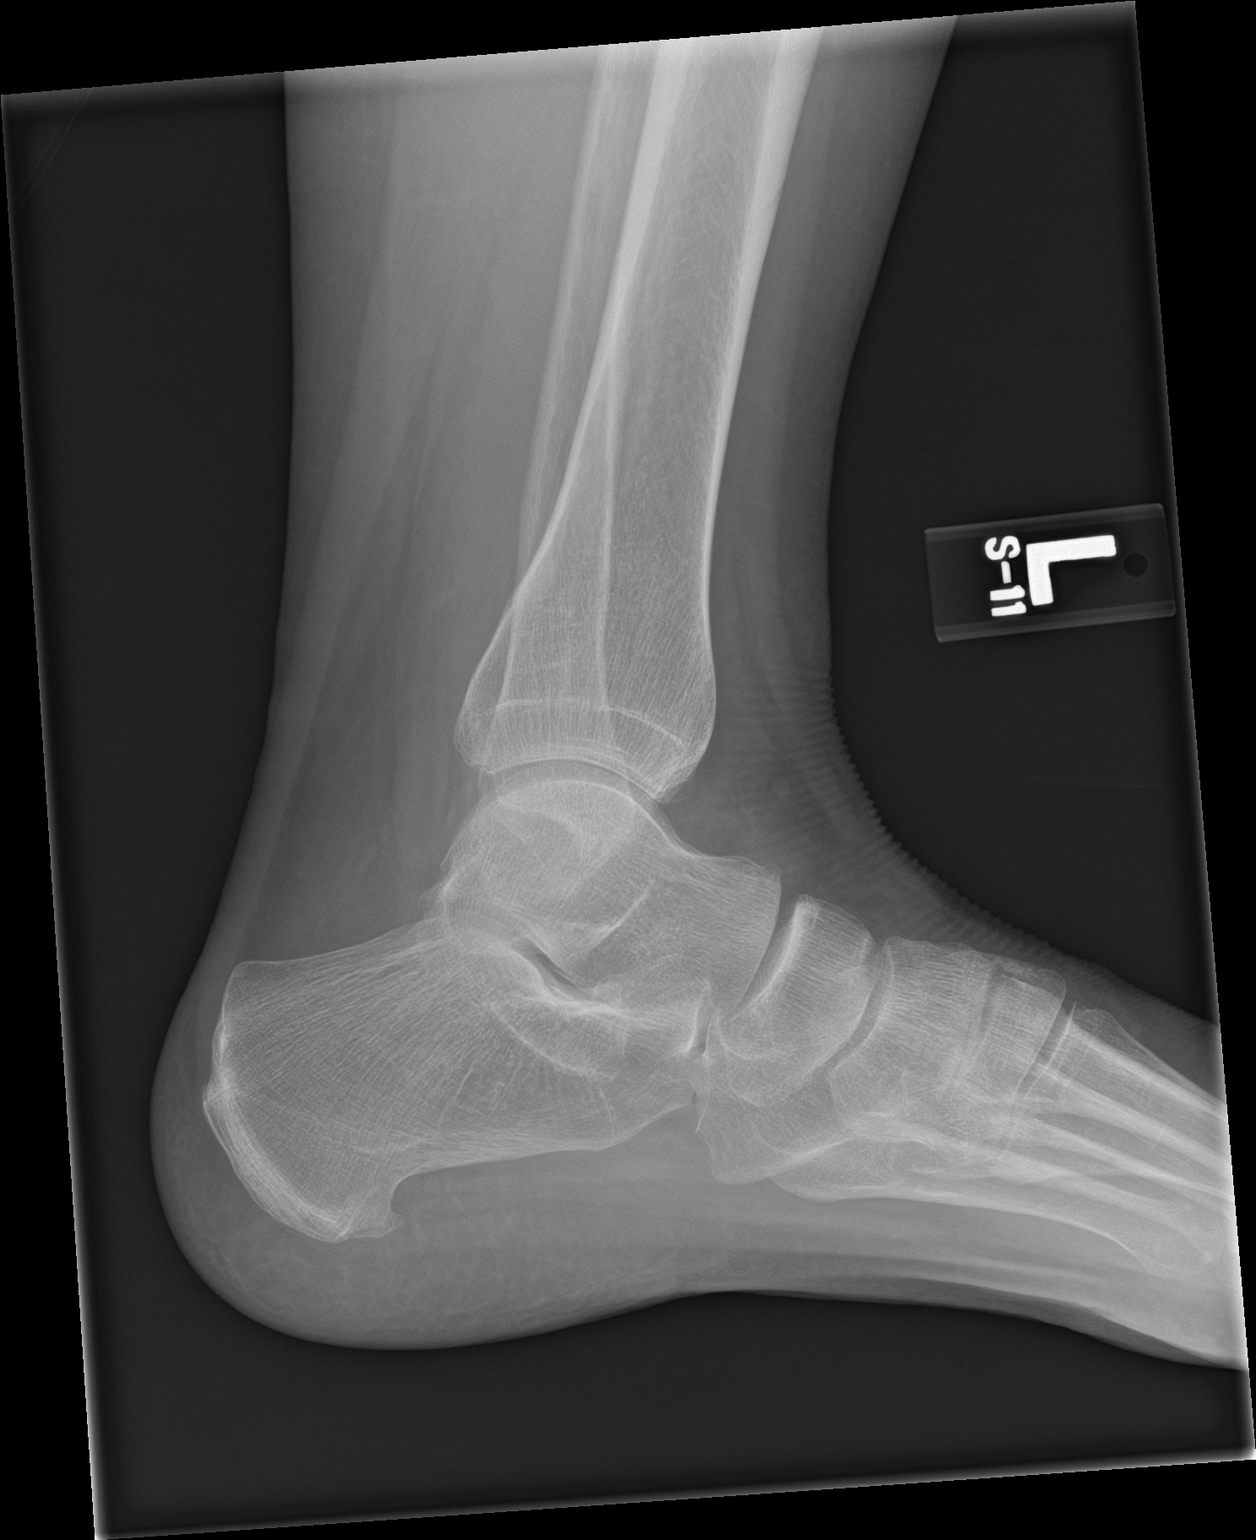

[3 of 3 positions shown; findings below may reference images not displayed]

FINDINGS: Frontal, oblique, and lateral views were obtained. There is no
fracture or joint effusion. The ankle mortise appears intact. There
is no appreciable joint space narrowing. No blastic or lytic bone
lesions evident. There is a small inferior calcaneal spur.
IMPRESSION: Small inferior calcaneal spur. No fracture or appreciable
arthropathy. Ankle mortise appears intact.
